# Patient Record
Sex: Male | Born: 1975 | Race: White | Hispanic: No | Marital: Married | Smoking: Current every day smoker
Health system: Southern US, Community
[De-identification: ages and names within clinical notes are randomized; demographics above are authoritative.]

## PROBLEM LIST (undated history)

## (undated) HISTORY — PX: APPENDECTOMY: SHX54

## (undated) HISTORY — PX: MENISCUS REPAIR: SHX5179

---

## 1997-10-13 ENCOUNTER — Emergency Department (HOSPITAL_COMMUNITY): Admission: EM | Admit: 1997-10-13 | Discharge: 1997-10-13 | Payer: Self-pay | Admitting: Internal Medicine

## 1997-10-20 ENCOUNTER — Emergency Department (HOSPITAL_COMMUNITY): Admission: EM | Admit: 1997-10-20 | Discharge: 1997-10-20 | Payer: Self-pay | Admitting: Emergency Medicine

## 1997-11-16 ENCOUNTER — Emergency Department (HOSPITAL_COMMUNITY): Admission: EM | Admit: 1997-11-16 | Discharge: 1997-11-16 | Payer: Self-pay | Admitting: Emergency Medicine

## 1998-04-14 ENCOUNTER — Emergency Department (HOSPITAL_COMMUNITY): Admission: EM | Admit: 1998-04-14 | Discharge: 1998-04-14 | Payer: Self-pay | Admitting: Emergency Medicine

## 2018-05-22 ENCOUNTER — Ambulatory Visit: Payer: Self-pay | Admitting: Surgery

## 2018-05-22 NOTE — H&P (Signed)
Surgical H&P  CC: hernia  HPI: otherwise healthy 43 year old man who has noticed an umbilical hernia for the last 3 years or so.  He thinks it may have slightly increased in size and it does cause occasional discomfort with certain activities.  He does note that he has gotten bloated more easily after eating which he does attribute to the hernia.  No prior abdominal surgeries.  Does not smoke.  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  Allergies not on file  No past medical history on file.  No family history on file.  Social History   Socioeconomic History  . Marital status: Single    Spouse name: Not on file  . Number of children: Not on file  . Years of education: Not on file  . Highest education level: Not on file  Occupational History  . Not on file  Social Needs  . Financial resource strain: Not on file  . Food insecurity:    Worry: Not on file    Inability: Not on file  . Transportation needs:    Medical: Not on file    Non-medical: Not on file  Tobacco Use  . Smoking status: Not on file  Substance and Sexual Activity  . Alcohol use: Not on file  . Drug use: Not on file  . Sexual activity: Not on file  Lifestyle  . Physical activity:    Days per week: Not on file    Minutes per session: Not on file  . Stress: Not on file  Relationships  . Social connections:    Talks on phone: Not on file    Gets together: Not on file    Attends religious service: Not on file    Active member of club or organization: Not on file    Attends meetings of clubs or organizations: Not on file    Relationship status: Not on file  Other Topics Concern  . Not on file  Social History Narrative  . Not on file    No current outpatient medications on file prior to visit.   No current facility-administered medications on file prior to visit.     Review of Systems: a complete, 10pt  review of systems was completed with pertinent positives and negatives as documented in the HPI  Physical Exam: There were no vitals filed for this visit.  BMI 31.79  Gen: alert and well appearing Eye: extraocular motion intact, no scleral icterus ENT: moist mucus membranes, dentition intact Neck: no mass or thyromegaly Chest: unlabored respirations, symmetrical air entry, clear bilaterally CV: regular rate and rhythm, no pedal edema Abdomen: soft, nontender, nondistended.  Chronically incarcerated umbilical hernia containing fat, defect is not palpable this is tender. MSK: strength symmetrical throughout, no deformity Neuro: grossly intact, normal gait Psych: normal mood and affect, appropriate insight Skin: warm and dry, no rash or lesion on limited exam    No flowsheet data found.  No flowsheet data found.  No results found for: INR, PROTIME  Imaging: No results found.   A/P: UMBILICAL HERNIA, INCARCERATED (K42.0) Story: Discussed open versus laparoscopic repair. Given small hernia size and first occurrence I recommend open approach Discussed risks of bleeding, infection, pain, scarring, injury to intra-abdominal structures, hernia recurrence. Advised that her hernia defect less than 1.5 cm, primary repair will be performed but is greater than not measured the implanted given increased risk of  recurrence. Discussed general risks of anesthesia including DVT/PE, stroke, heart attack, pneumonia, etc. Discussed the option of ongoing observation with the risk of increasing size of the hernia, increasing symptoms, and very small risk of bowel incarceration. Discussed that postoperatively I advised against any strenuous activity for about 6 weeks. Questions were answered to the patient's satisfaction. We'll schedule at his convenience.   Phylliss Blakes, MD [facility], [place] Pager [phone]

## 2020-08-15 ENCOUNTER — Telehealth: Payer: Self-pay | Admitting: Emergency Medicine

## 2020-08-15 ENCOUNTER — Emergency Department (INDEPENDENT_AMBULATORY_CARE_PROVIDER_SITE_OTHER): Payer: BC Managed Care – PPO

## 2020-08-15 ENCOUNTER — Encounter: Payer: Self-pay | Admitting: Emergency Medicine

## 2020-08-15 ENCOUNTER — Other Ambulatory Visit: Payer: Self-pay

## 2020-08-15 ENCOUNTER — Emergency Department
Admission: EM | Admit: 2020-08-15 | Discharge: 2020-08-15 | Disposition: A | Discharge status: Home / Self Care | Payer: Self-pay | Source: Home / Self Care | Attending: Family Medicine | Admitting: Family Medicine

## 2020-08-15 DIAGNOSIS — S8012XA Contusion of left lower leg, initial encounter: Secondary | ICD-10-CM

## 2020-08-15 DIAGNOSIS — Y9366 Activity, soccer: Secondary | ICD-10-CM

## 2020-08-15 DIAGNOSIS — R2242 Localized swelling, mass and lump, left lower limb: Secondary | ICD-10-CM | POA: Diagnosis not present

## 2020-08-15 DIAGNOSIS — M79662 Pain in left lower leg: Secondary | ICD-10-CM

## 2020-08-15 LAB — COMPLETE METABOLIC PANEL WITH GFR
AG Ratio: 1.5 (calc) (ref 1.0–2.5)
ALT: 25 U/L (ref 9–46)
AST: 24 U/L (ref 10–40)
Albumin: 4.6 g/dL (ref 3.6–5.1)
Alkaline phosphatase (APISO): 57 U/L (ref 36–130)
BUN: 12 mg/dL (ref 7–25)
CO2: 26 mmol/L (ref 20–32)
Calcium: 9.2 mg/dL (ref 8.6–10.3)
Chloride: 103 mmol/L (ref 98–110)
Creat: 1.07 mg/dL (ref 0.60–1.35)
GFR, Est African American: 96 mL/min/{1.73_m2} (ref >=60)
GFR, Est Non African American: 83 mL/min/{1.73_m2} (ref >=60)
Globulin: 3 g/dL (calc) (ref 1.9–3.7)
Glucose, Bld: 92 mg/dL (ref 65–99)
Potassium: 4.2 mmol/L (ref 3.5–5.3)
Sodium: 137 mmol/L (ref 135–146)
Total Bilirubin: 0.8 mg/dL (ref 0.2–1.2)
Total Protein: 7.6 g/dL (ref 6.1–8.1)

## 2020-08-15 LAB — POCT URINALYSIS DIP (MANUAL ENTRY)
Bilirubin, UA: NEGATIVE
Blood, UA: NEGATIVE
Glucose, UA: NEGATIVE mg/dL
Leukocytes, UA: NEGATIVE
Nitrite, UA: NEGATIVE
Spec Grav, UA: 1.025 (ref 1.010–1.025)
Urobilinogen, UA: 0.2 E.U./dL
pH, UA: 5.5 (ref 5.0–8.0)

## 2020-08-15 LAB — CK: Total CK: 627 U/L — ABNORMAL HIGH (ref 44–196)

## 2020-08-15 MED ORDER — PREDNISONE 20 MG PO TABS: ORAL_TABLET | ORAL | 0 refills | Status: AC

## 2020-08-15 MED ORDER — ACETAMINOPHEN 325 MG PO TABS
650.0000 mg | ORAL_TABLET | ORAL | Status: AC
  Administered 2020-08-15: 650 mg via ORAL

## 2020-08-15 NOTE — Telephone Encounter (Signed)
CK elevated - see lab results- Dr Cathren Harsh requests George Mccann follow up w/ Dr T for a recheck. Pt instructed to increase hydration until urine is a light yellow - pt verbalized an understanding. RN spoke w/ Dr Karie Schwalbe - his office to schedule appointment for pt at the end of the week or beginning of next week. George Mccann stated Dr Melvia Heaps office had called while he was on the phone w/ me.

## 2020-08-15 NOTE — Discharge Instructions (Addendum)
Increase fluid intake until urine is pale yellow.  Rest.  Elevate leg to minimize swelling.  May take Tylenol as needed for pain. Avoid athletic activities until symptoms resolve.

## 2020-08-15 NOTE — ED Triage Notes (Signed)
Pain since Sunday after playing soccer - another player  Ran into pt's left calf - pain immediately  Ice & elevation since yesterday No OTC meds COVID vaccine

## 2020-08-15 NOTE — Telephone Encounter (Signed)
Stat pickup called at 1140 - lab picke dup at 1200 - confirmation # [id number]

## 2020-08-15 NOTE — ED Notes (Signed)
New arm band printed out - name misspelled - extra letter I in name - correct in Epic

## 2020-08-15 NOTE — ED Provider Notes (Signed)
[facility]    CSN: [id number] Arrival date & time: 08/15/20  6270      History   Chief Complaint Chief Complaint  Patient presents with  . Leg Pain    Left      HPI George Mccann is a 45 y.o. male.   While playing soccer two days ago, another player collided with patient's left anterior/medial lower leg.  He experienced immediate pain and stopped playing immediately.  During the past 48 hours he has had increasing pain/swelling over his left medial calf, and he notes that his entire left lower leg feels tight.  His symptoms have become worse after standing at work, not improving after ice/elevation.  He denies fever, chest pain and shortness of breath.  He denies history of DVT.  Note that patient is a smoker.  The history is provided by the patient.  Leg Pain Location:  Leg Time since incident:  2 days Injury: yes   Mechanism of injury comment:  Contusion during soccer game Leg location:  L leg Pain details:    Quality:  Aching and pressure   Radiates to:  Does not radiate   Severity:  Moderate   Onset quality:  Sudden   Duration:  2 days   Timing:  Constant   Progression:  Worsening Chronicity:  New Prior injury to area:  No Relieved by:  Nothing Worsened by:  Activity and bearing weight Ineffective treatments:  Ice and elevation Associated symptoms: stiffness and swelling   Associated symptoms: no decreased ROM, no fatigue, no fever, no itching, no muscle weakness, no numbness and no tingling     History reviewed. No pertinent past medical history.  There are no problems to display for this patient.   Past Surgical History:  Procedure Laterality Date  . APPENDECTOMY    . MENISCUS REPAIR Left        Home Medications    Prior to Admission medications   Medication Sig Start Date End Date Taking? Authorizing Provider  predniSONE (DELTASONE) 20 MG tablet Take one tab by mouth twice daily for 4 days, then one daily for 3 days. Take with food.  08/15/20  Yes Lattie Haw, MD    Family History Family History  Problem Relation Age of Onset  . Healthy Father   . Healthy Sister     Social History Social History   Tobacco Use  . Smoking status: Current Every Day Smoker    Types: E-cigarettes  . Smokeless tobacco: Never Used  Vaping Use  . Vaping Use: Every day  . Substances: Nicotine, Flavoring  Substance Use Topics  . Alcohol use: Not Currently  . Drug use: Never     Allergies   Penicillins   Review of Systems Review of Systems  Constitutional: Positive for activity change. Negative for appetite change, chills, diaphoresis, fatigue and fever.  HENT: Negative.   Eyes: Negative.   Respiratory: Negative for cough, chest tightness, shortness of breath, wheezing and stridor.   Cardiovascular: Positive for leg swelling. Negative for chest pain and palpitations.  Gastrointestinal: Negative.   Genitourinary: Negative for decreased urine volume.  Musculoskeletal: Positive for stiffness. Negative for joint swelling.  Skin: Negative for color change and itching.  Neurological: Negative for numbness.  Hematological: Negative for adenopathy.     Physical Exam Triage Vital Signs ED Triage Vitals  Enc Vitals Group     BP 08/15/20 0852 130/84     Pulse Rate 08/15/20 0852 75     Resp  08/15/20 0852 16     Temp 08/15/20 0852 98.4 F (36.9 C)     Temp Source 08/15/20 0852 Oral     SpO2 08/15/20 0852 98 %     Weight 08/15/20 0856 228 lb (103.4 kg)     Height 08/15/20 0856 6' (1.829 m)     Head Circumference --      Peak Flow --      Pain Score 08/15/20 0855 5     Pain Loc --      Pain Edu? --      Excl. in GC? --    No data found.  Updated Vital Signs BP 130/84 (BP Location: Right Arm)   Pulse 75   Temp 98.4 F (36.9 C) (Oral)   Resp 16   Ht 6' (1.829 m)   Wt 103.4 kg   SpO2 98%   BMI 30.92 kg/m   Visual Acuity Right Eye Distance:   Left Eye Distance:   Bilateral Distance:    Right Eye Near:    Left Eye Near:    Bilateral Near:     Physical Exam Constitutional:      General: He is not in acute distress. HENT:     Head: Atraumatic.     Mouth/Throat:     Mouth: Mucous membranes are moist.  Eyes:     Pupils: Pupils are equal, round, and reactive to light.  Cardiovascular:     Rate and Rhythm: Normal rate.     Heart sounds: Normal heart sounds.  Pulmonary:     Breath sounds: Normal breath sounds.  Abdominal:     Palpations: Abdomen is soft.     Tenderness: There is no abdominal tenderness.  Musculoskeletal:        General: Swelling and tenderness present.     Left lower leg: Tenderness present. No bony tenderness. Edema present.       Legs:     Comments: Left lower leg mildly swollen with tenderness to palpation over medial pretibial and medial calf area.  Pedal pulses intact.  Skin:    General: Skin is warm and dry.     Findings: No erythema.  Neurological:     Mental Status: He is alert and oriented to person, place, and time.      UC Treatments / Results  Labs (all labs ordered are listed, but only abnormal results are displayed) Labs Reviewed  POCT URINALYSIS DIP (MANUAL ENTRY) - Abnormal; Notable for the following components:      Result Value   Color, UA other (*)    Ketones, POC UA trace (5) (*)    Protein Ur, POC trace (*)    All other components within normal limits  CK  COMPLETE METABOLIC PANEL WITH GFR    EKG   Radiology DG Tibia/Fibula Left  Result Date: 08/15/2020 CLINICAL DATA:  Left lower leg pain following a soccer injury 2 days ago. EXAM: LEFT TIBIA AND FIBULA - 2 VIEW COMPARISON:  None. FINDINGS: There is no evidence of fracture or other focal bone lesions. Soft tissues are unremarkable. IMPRESSION: Normal examination. Electronically Signed   By: Beckie Salts M.D.   On: 08/15/2020 09:36   US Venous Img Lower Unilateral Left  Result Date: 08/15/2020 CLINICAL DATA:  Soccer injury 2 days ago with persistent pain and swelling in the  left leg EXAM: LEFT LOWER EXTREMITY VENOUS DOPPLER ULTRASOUND TECHNIQUE: Gray-scale sonography with graded compression, as well as color Doppler and duplex ultrasound were performed to evaluate  the lower extremity deep venous systems from the level of the common femoral vein and including the common femoral, femoral, profunda femoral, popliteal and calf veins including the posterior tibial, peroneal and gastrocnemius veins when visible. The superficial great saphenous vein was also interrogated. Spectral Doppler was utilized to evaluate flow at rest and with distal augmentation maneuvers in the common femoral, femoral and popliteal veins. COMPARISON:  None. FINDINGS: Contralateral Common Femoral Vein: Respiratory phasicity is normal and symmetric with the symptomatic side. No evidence of thrombus. Normal compressibility. Common Femoral Vein: No evidence of thrombus. Normal compressibility, respiratory phasicity and response to augmentation. Saphenofemoral Junction: No evidence of thrombus. Normal compressibility and flow on color Doppler imaging. Profunda Femoral Vein: No evidence of thrombus. Normal compressibility and flow on color Doppler imaging. Femoral Vein: No evidence of thrombus. Normal compressibility, respiratory phasicity and response to augmentation. Popliteal Vein: No evidence of thrombus. Normal compressibility, respiratory phasicity and response to augmentation. Calf Veins: No evidence of thrombus. Normal compressibility and flow on color Doppler imaging. Superficial Great Saphenous Vein: No evidence of thrombus. Normal compressibility. Venous Reflux:  None. Other Findings:  None. IMPRESSION: No evidence of deep venous thrombosis. Electronically Signed   By: Alcide Clever M.D.   On: 08/15/2020 11:01    Procedures Procedures (including critical care time)  Medications Ordered in UC Medications  acetaminophen (TYLENOL) tablet 650 mg (650 mg Oral Given 08/15/20 0908)    Initial Impression /  Assessment and Plan / UC Course  I have reviewed the triage vital signs and the nursing notes.  Pertinent labs & imaging results that were available during my care of the patient were reviewed by me and considered in my medical decision making (see chart for details).    No evidence DVT or fracture. Concern for possible mild rhabdomyolysis.  CK pending. Begin prednisone burst/taper.   Followup with Dr[facility] If CK elevated.   Final Clinical Impressions(s) / UC Diagnoses   Final diagnoses:  Contusion of left lower leg, initial encounter     Discharge Instructions     Increase fluid intake until urine is pale yellow.  Rest.  Elevate leg to minimize swelling.  May take Tylenol as needed for pain. Avoid athletic activities until symptoms resolve.    ED Prescriptions    Medication Sig Dispense Auth. Provider   predniSONE (DELTASONE) 20 MG tablet Take one tab by mouth twice daily for 4 days, then one daily for 3 days. Take with food. 11 tablet Lattie Haw, MD        Lattie Haw, MD 08/17/20 [phone]

## 2020-08-16 ENCOUNTER — Ambulatory Visit: Payer: BC Managed Care – PPO | Admitting: Sports Medicine

## 2020-08-22 ENCOUNTER — Ambulatory Visit: Payer: BC Managed Care – PPO | Admitting: Sports Medicine

## 2022-04-02 IMAGING — DX DG TIBIA/FIBULA 2V*L*
4 series · 4 of 4 positions shown · non-contrast
Comparison: None.

CLINICAL DATA: Left lower leg pain following a soccer injury 2 days
ago.

EXAM:
LEFT TIBIA AND FIBULA - 2 VIEW

[tibia ap (1 of 2)]
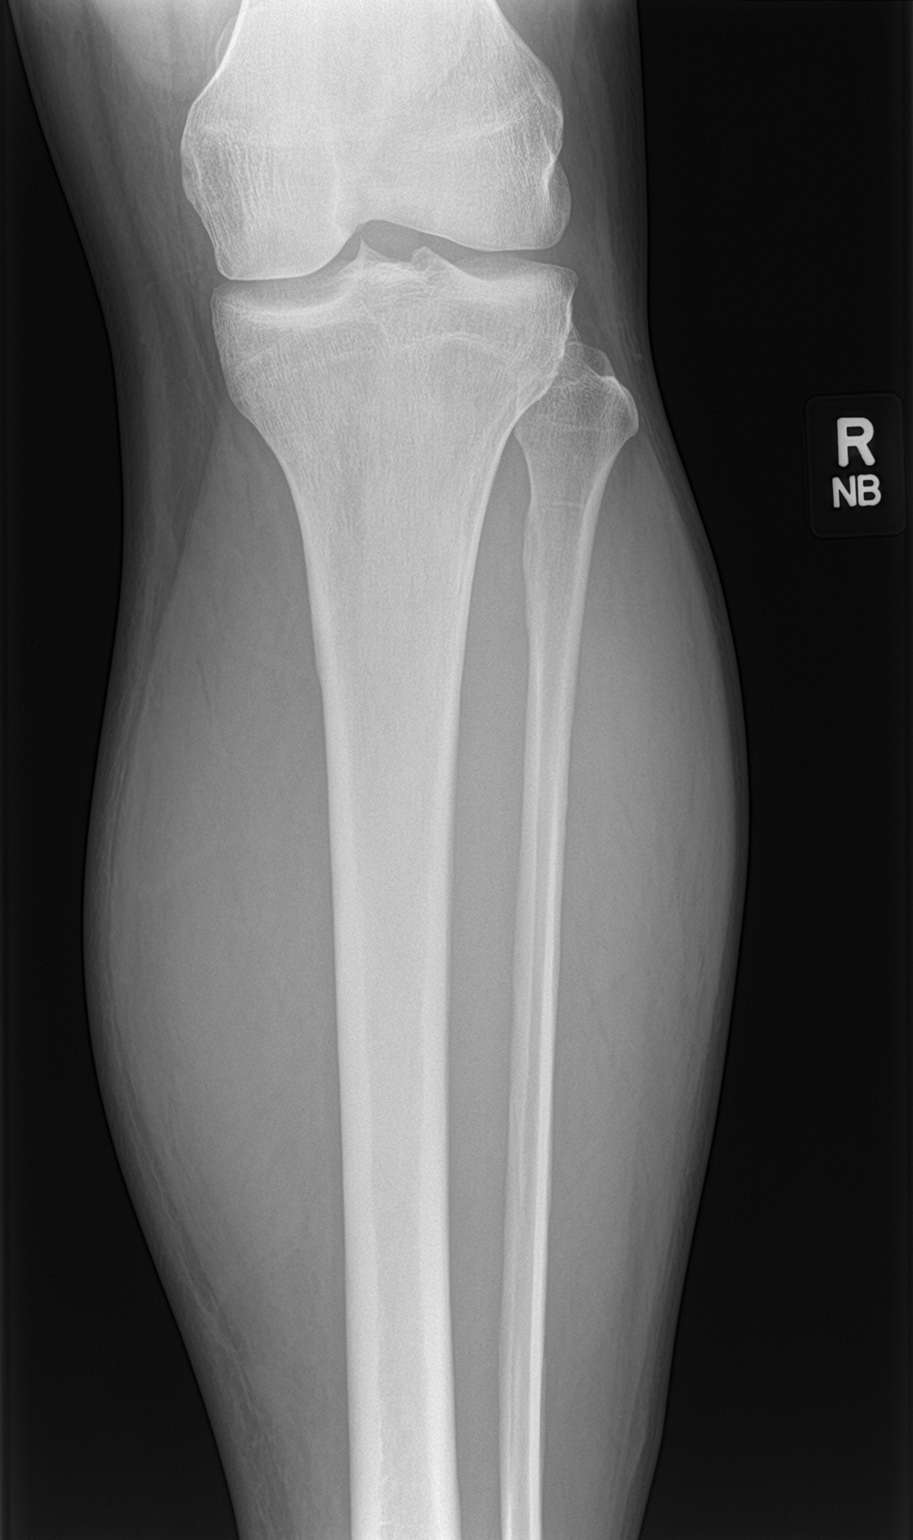

[tibia ap (2 of 2)]
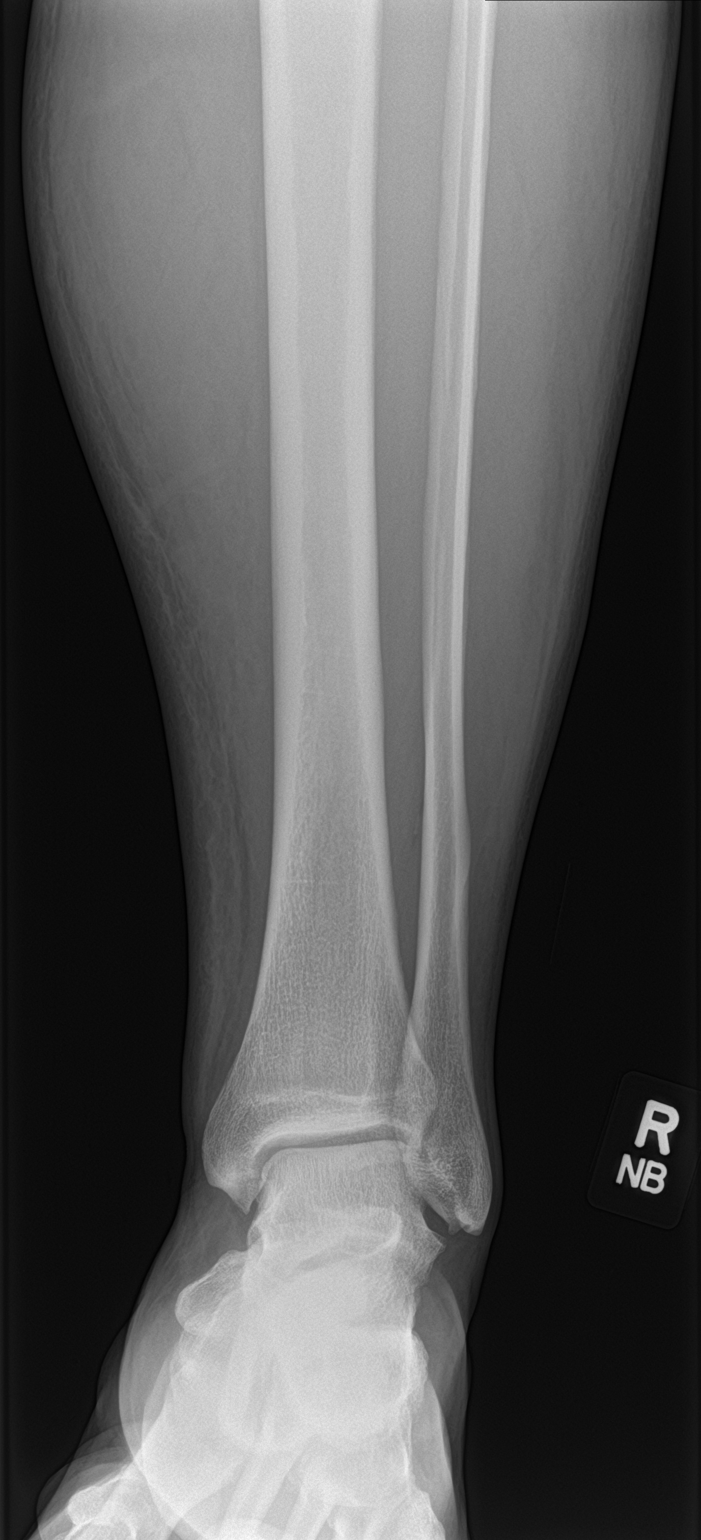

[tibia lat (1 of 2)]
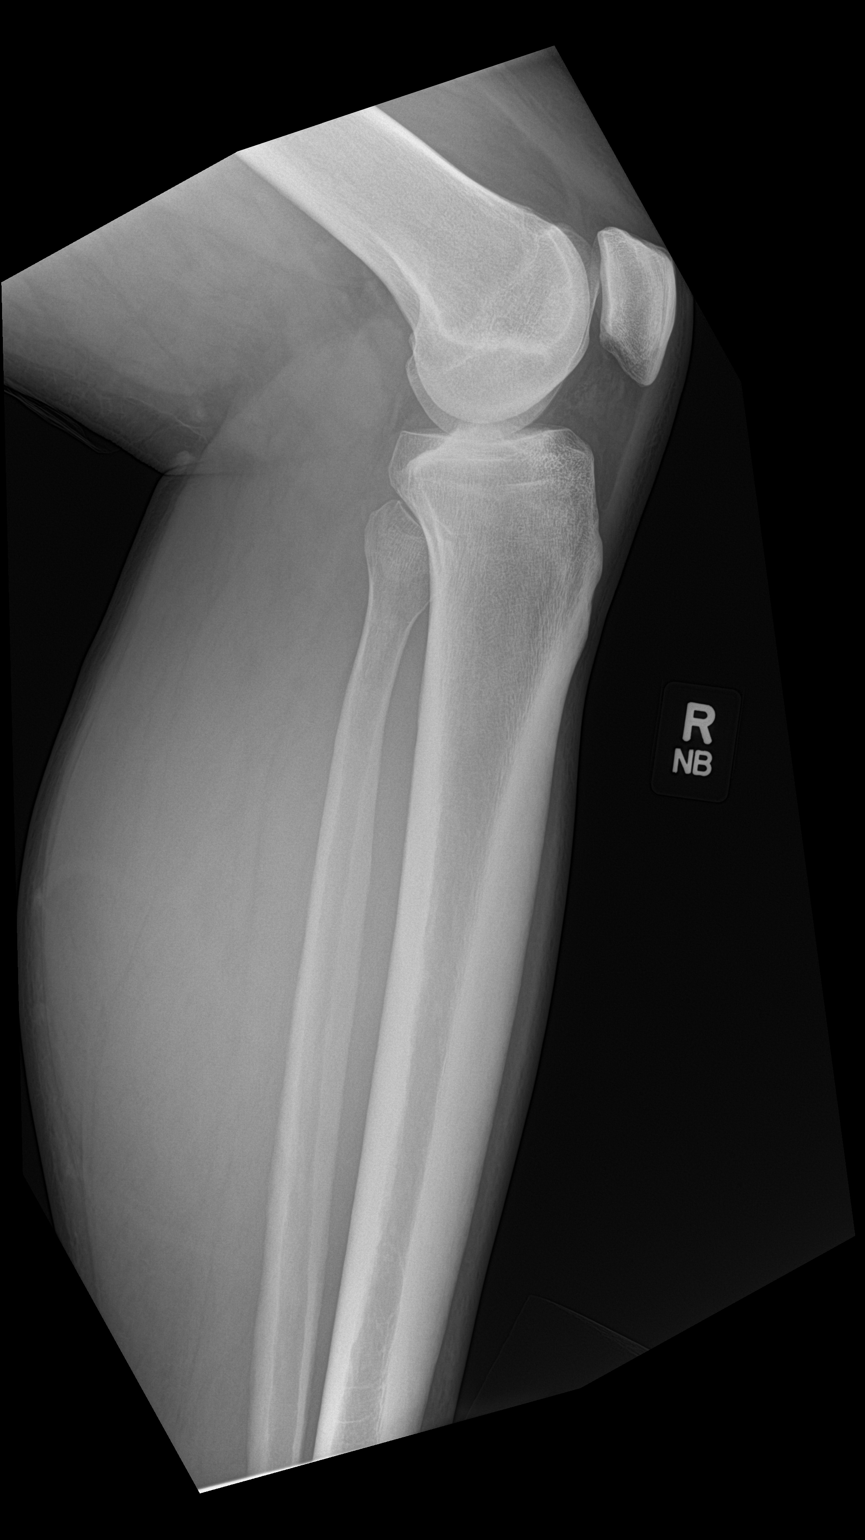

[tibia lat (2 of 2)]
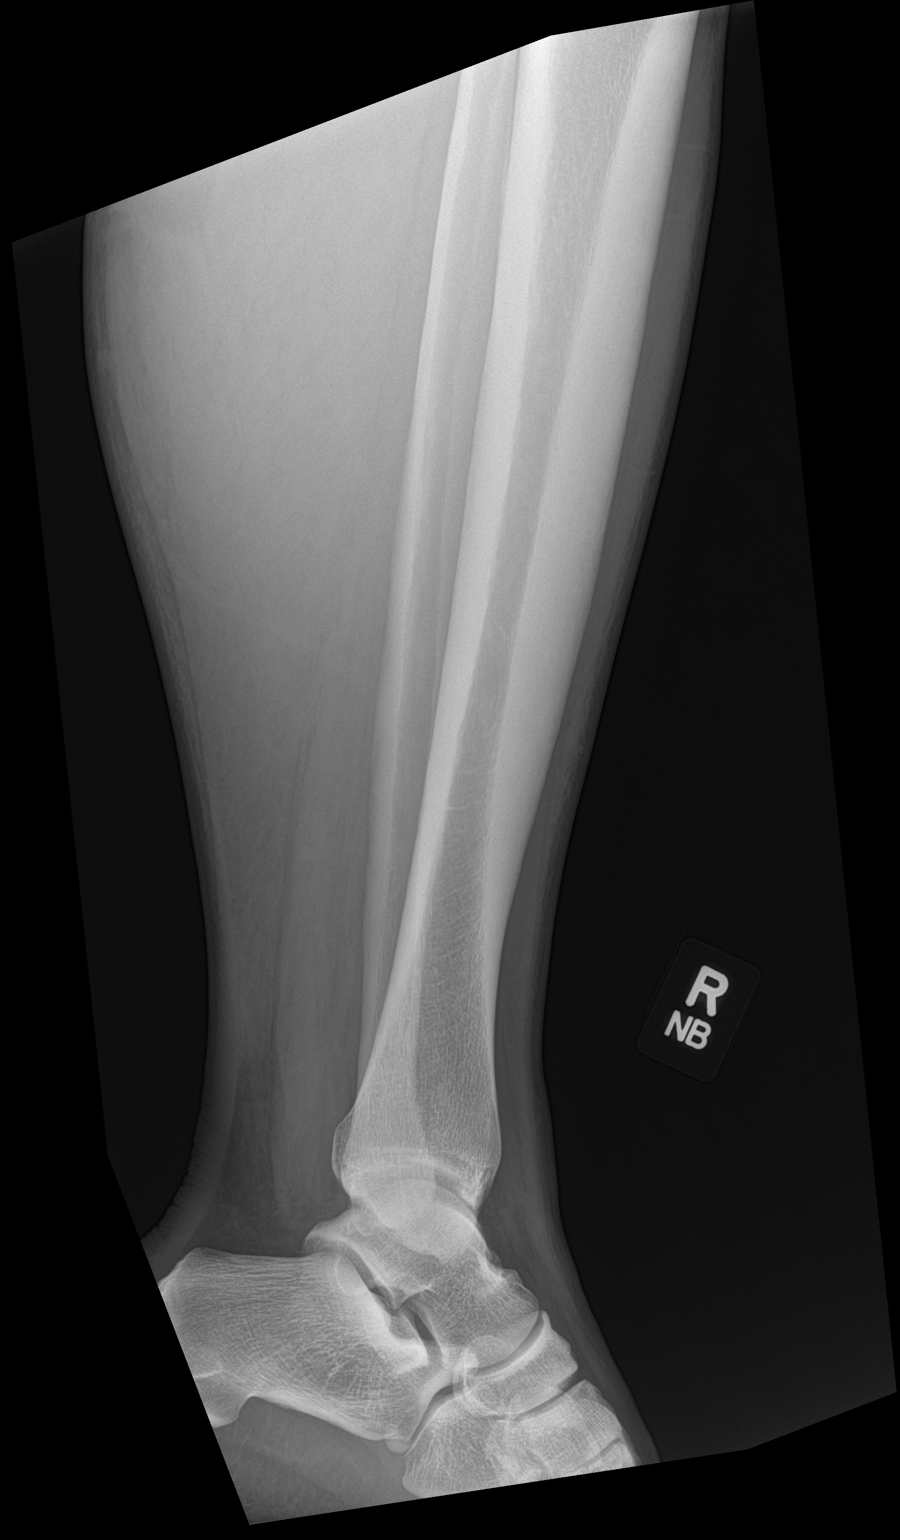

[4 of 4 positions shown; findings below may reference images not displayed]

FINDINGS: There is no evidence of fracture or other focal bone lesions. Soft
tissues are unremarkable.
IMPRESSION: Normal examination.

## 2022-04-02 IMAGING — US US EXTREM LOW VENOUS*L*
1 series · 13 of 24 positions shown · non-contrast
Comparison: None.

CLINICAL DATA: Soccer injury 2 days ago with persistent pain and
swelling in the left leg



[Series 1: us venous img lower uni left (dvt) · portal-venous · 13 of 39 slices shown]
[im 1/39]
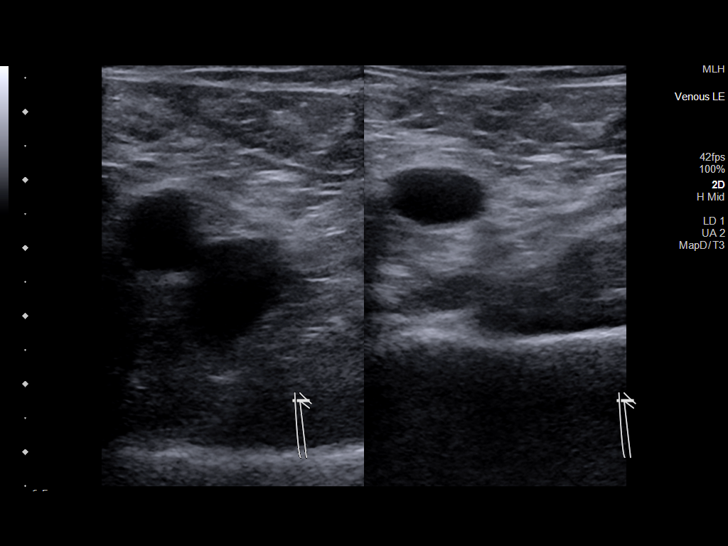
[im 4/39]
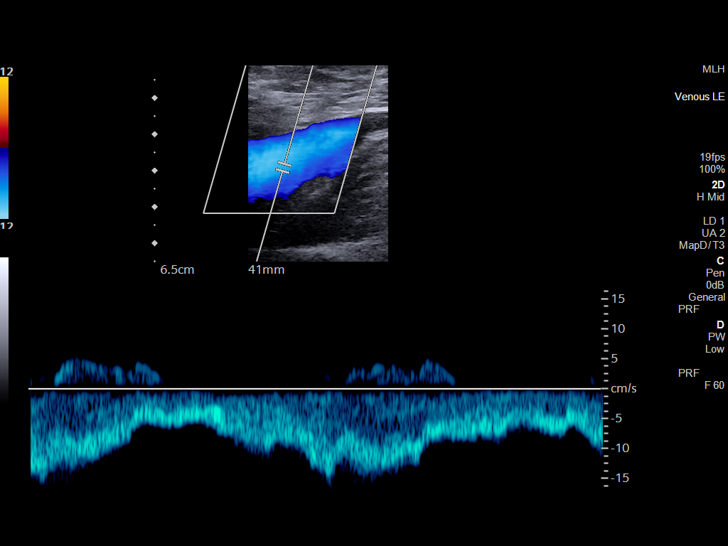
[im 7/39]
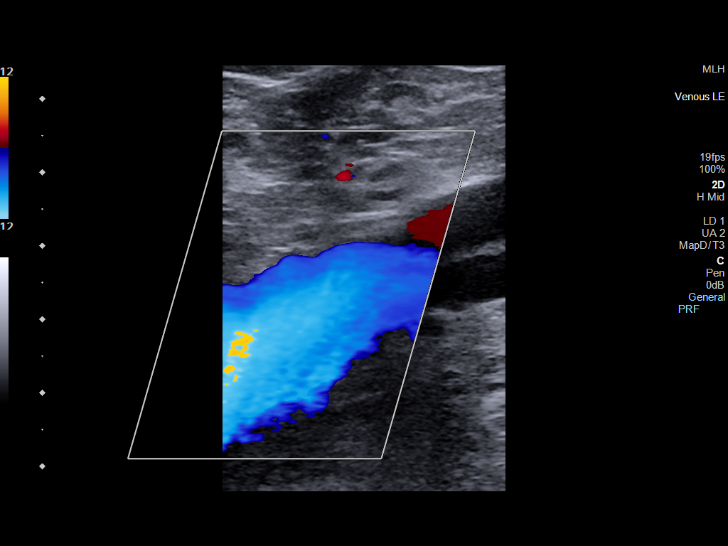
[im 10/39]
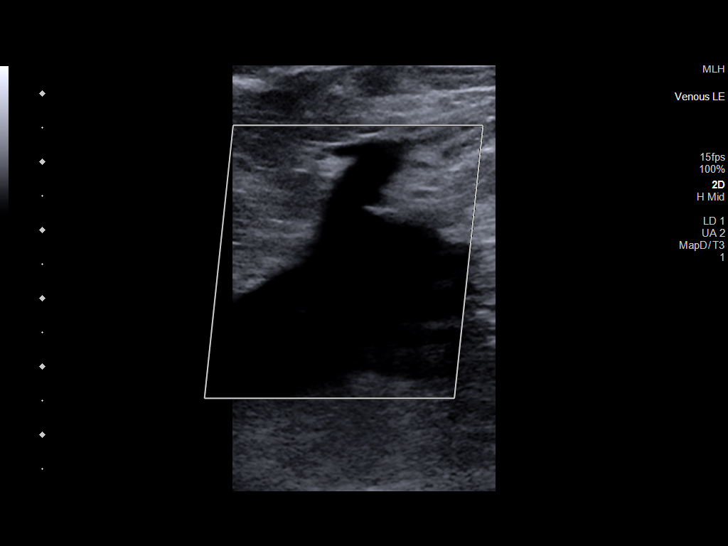
[im 14/39]
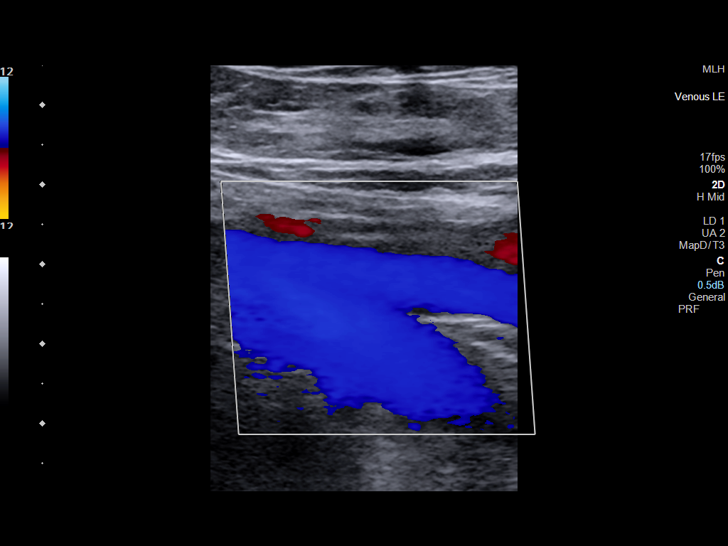
[im 17/39]
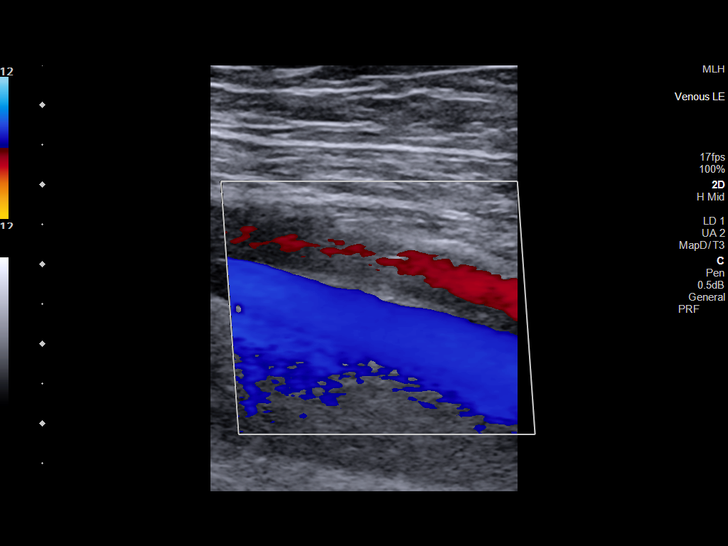
[im 20/39]
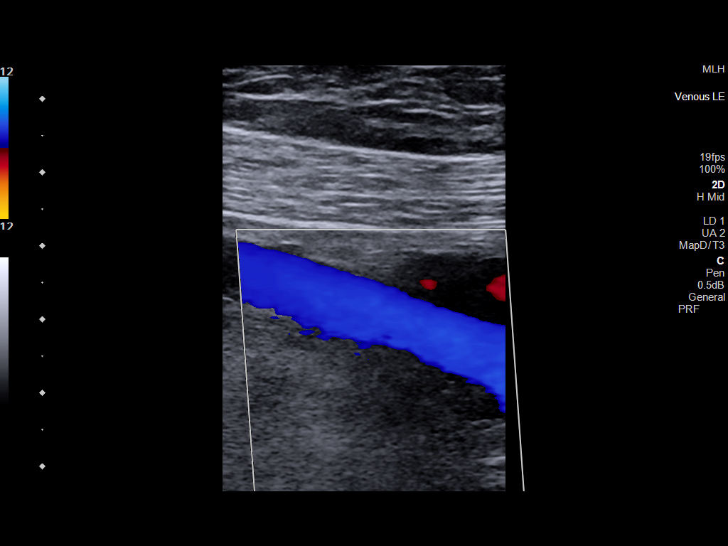
[im 22/39]
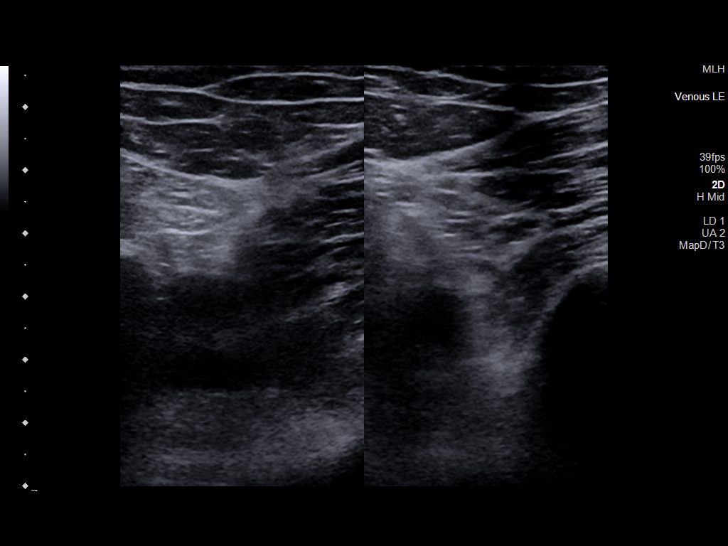
[im 25/39]
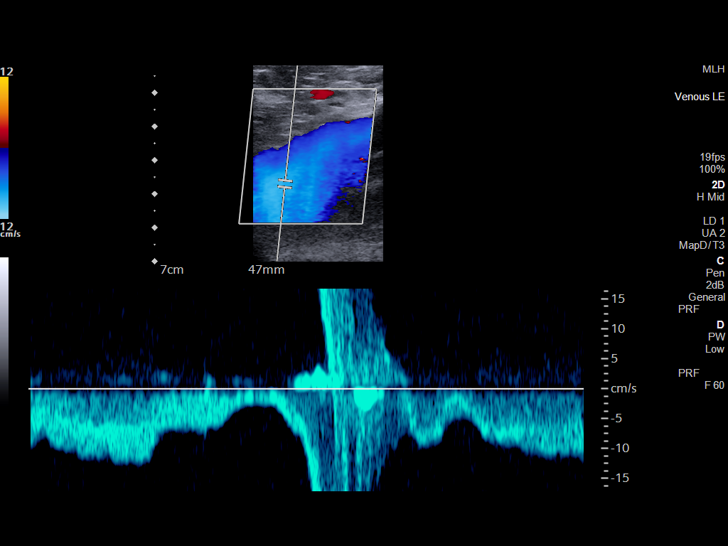
[im 29/39]
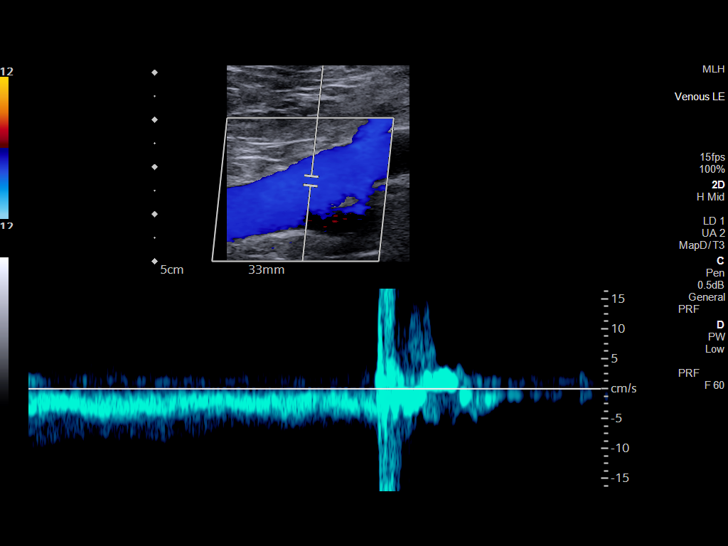
[im 32/39]
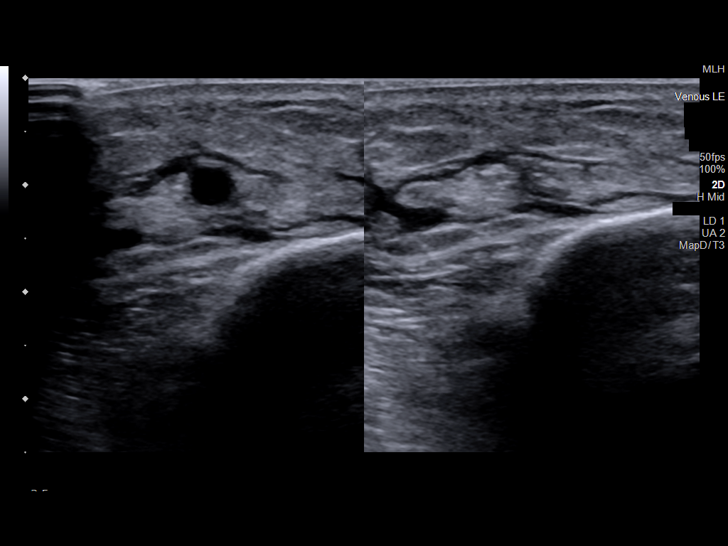
[im 35/39]
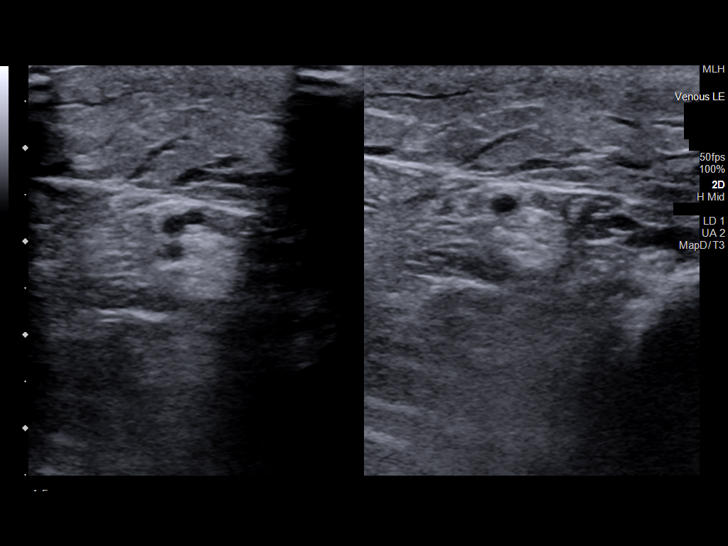
[im 39/39]
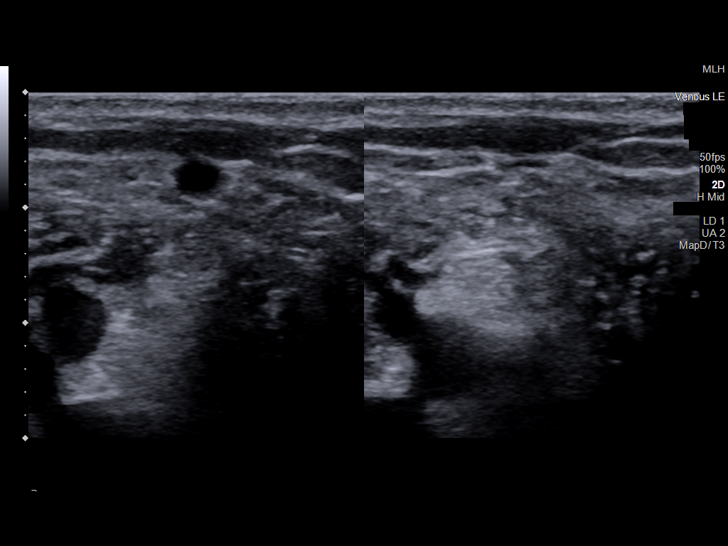

[13 of 24 positions shown; findings below may reference images not displayed]

FINDINGS: Contralateral Common Femoral Vein: Respiratory phasicity is normal
and symmetric with the symptomatic side. No evidence of thrombus.
Normal compressibility.

Common Femoral Vein: No evidence of thrombus. Normal
compressibility, respiratory phasicity and response to augmentation.

Saphenofemoral Junction: No evidence of thrombus. Normal
compressibility and flow on color Doppler imaging.

Profunda Femoral Vein: No evidence of thrombus. Normal
compressibility and flow on color Doppler imaging.

Femoral Vein: No evidence of thrombus. Normal compressibility,
respiratory phasicity and response to augmentation.

Popliteal Vein: No evidence of thrombus. Normal compressibility,
respiratory phasicity and response to augmentation.

Calf Veins: No evidence of thrombus. Normal compressibility and flow
on color Doppler imaging.

Superficial Great Saphenous Vein: No evidence of thrombus. Normal
compressibility.

Venous Reflux:  None.

Other Findings:  None.
IMPRESSION: No evidence of deep venous thrombosis.
